# Patient Record
Sex: Male | Born: 1990 | Race: White | Hispanic: No | Marital: Single | Smoking: Never smoker
Health system: Southern US, Community
[De-identification: ages and names within clinical notes are randomized; demographics above are authoritative.]

## PROBLEM LIST (undated history)

## (undated) DIAGNOSIS — H9190 Unspecified hearing loss, unspecified ear: Secondary | ICD-10-CM

---
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
## 2009-01-16 ENCOUNTER — Emergency Department (HOSPITAL_COMMUNITY): Admission: EM | Admit: 2009-01-16 | Discharge: 2009-01-16 | Payer: Self-pay | Admitting: Emergency Medicine

## 2009-07-21 ENCOUNTER — Emergency Department (HOSPITAL_COMMUNITY): Admission: EM | Admit: 2009-07-21 | Discharge: 2009-07-21 | Payer: Self-pay | Admitting: Emergency Medicine

## 2010-01-31 ENCOUNTER — Emergency Department (HOSPITAL_COMMUNITY): Admission: EM | Admit: 2010-01-31 | Discharge: 2010-01-31 | Payer: Self-pay | Admitting: Family Medicine

## 2010-04-10 ENCOUNTER — Emergency Department (HOSPITAL_COMMUNITY): Admission: EM | Admit: 2010-04-10 | Discharge: 2010-04-10 | Payer: Self-pay | Admitting: Family Medicine

## 2010-06-20 ENCOUNTER — Emergency Department (HOSPITAL_COMMUNITY): Admission: EM | Admit: 2010-06-20 | Discharge: 2010-06-21 | Payer: Self-pay | Admitting: Emergency Medicine
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
## 2011-11-13 ENCOUNTER — Encounter (HOSPITAL_COMMUNITY): Payer: Self-pay | Admitting: *Deleted

## 2011-11-13 ENCOUNTER — Emergency Department (HOSPITAL_COMMUNITY)
Admission: EM | Admit: 2011-11-13 | Discharge: 2011-11-13 | Disposition: A | Discharge status: Home / Self Care | Payer: Medicaid Other | Source: Home / Self Care | Attending: Family Medicine | Admitting: Family Medicine

## 2011-11-13 DIAGNOSIS — J029 Acute pharyngitis, unspecified: Secondary | ICD-10-CM

## 2011-11-13 MED ORDER — AMOXICILLIN 500 MG PO CAPS: 500.0000 mg | ORAL_CAPSULE | Freq: Three times a day (TID) | ORAL | Status: AC

## 2011-11-13 NOTE — ED Notes (Signed)
Pt  Is  Deaf  However  He  Communicates   Well    By  Reading  Lips  And  Writing  He    Declines  A  Deaf  Interpretor

## 2011-11-13 NOTE — Discharge Instructions (Signed)
Tylenol or motrin as needed. Avoid caffeine and milk products. Follow up with your pcp or return if symoptoms worsen. Throat spray of choice.

## 2011-11-13 NOTE — ED Notes (Signed)
Pt  Has  Symptoms  Of  sorethroat  Headache       Symptoms  X  2  Days       -  Pt  Reports  Hurts  To  Swallow      Pt  Is  In no  Acute  Distress  He  Is  Sitting  Upright on  Exam table        Skin is  Warm  Dry

## 2011-11-13 NOTE — ED Provider Notes (Signed)
History     CSN: [id number]  Arrival date & time 11/13/11  Ronald Bean   First MD Initiated Contact with Patient 11/13/11 1929      Chief Complaint  Patient presents with  . Sore Throat    (Consider location/radiation/quality/duration/timing/severity/associated sxs/prior treatment) HPI Comments: The patient reports having a sore throat x 2-3 days. No runny nose, no cough, no fever. No treatment pta. Denies n/v, no rash  The history is provided by the patient.    History reviewed. No pertinent past medical history.  History reviewed. No pertinent past surgical history.  History reviewed. No pertinent family history.  History  Substance Use Topics  . Smoking status: Not on file  . Smokeless tobacco: Not on file  . Alcohol Use: Yes     occasonal      Review of Systems  HENT: Positive for sore throat. Negative for ear pain, congestion and rhinorrhea.   Respiratory: Negative.   Cardiovascular: Negative.   Gastrointestinal: Negative.   Genitourinary: Negative.   Skin: Negative.     Allergies  Review of patient's allergies indicates no known allergies.  Home Medications   Current Outpatient Rx  Name Route Sig Dispense Refill  . NYQUIL PO Oral Take by mouth.    . AMOXICILLIN 500 MG PO CAPS Oral Take 1 capsule (500 mg total) by mouth 3 (three) times daily. 30 capsule 0    BP 138/82  Pulse 95  Temp(Src) 100.1 F (37.8 C) (Oral)  Resp 16  SpO2 98%  Physical Exam  Nursing note and vitals reviewed. Constitutional: He appears well-developed and well-nourished. No distress.  HENT:       Ears clear, nose clear, throat red with swollen tonsils. No exudates seen  Neck: Normal range of motion. Neck supple.  Cardiovascular: Normal rate and regular rhythm.   Pulmonary/Chest: Effort normal and breath sounds normal.  Lymphadenopathy:    He has cervical adenopathy.    ED Course  Procedures (including critical care time)  Labs Reviewed - No data to display No results  found.   1. Pharyngitis       MDM          Randa Spike, MD 11/13/11 2013

## 2012-11-09 ENCOUNTER — Encounter (HOSPITAL_COMMUNITY): Payer: Self-pay | Admitting: *Deleted

## 2012-11-09 ENCOUNTER — Emergency Department (INDEPENDENT_AMBULATORY_CARE_PROVIDER_SITE_OTHER)
Admission: EM | Admit: 2012-11-09 | Discharge: 2012-11-09 | Disposition: A | Discharge status: Home / Self Care | Payer: 59 | Source: Home / Self Care | Attending: Family Medicine | Admitting: Family Medicine

## 2012-11-09 DIAGNOSIS — B001 Herpesviral vesicular dermatitis: Secondary | ICD-10-CM

## 2012-11-09 DIAGNOSIS — B009 Herpesviral infection, unspecified: Secondary | ICD-10-CM

## 2012-11-09 DIAGNOSIS — J329 Chronic sinusitis, unspecified: Secondary | ICD-10-CM

## 2012-11-09 LAB — POCT RAPID STREP A: Streptococcus, Group A Screen (Direct): NEGATIVE

## 2012-11-09 MED ORDER — VALACYCLOVIR HCL 1 G PO TABS
ORAL_TABLET | ORAL | Status: DC
Start: 2012-11-09 — End: 2014-02-07

## 2012-11-09 MED ORDER — AZITHROMYCIN 250 MG PO TABS: ORAL_TABLET | ORAL | Status: DC

## 2012-11-09 MED ORDER — IPRATROPIUM BROMIDE 0.06 % NA SOLN: 2.0000 | Freq: Four times a day (QID) | NASAL | Status: DC

## 2012-11-09 NOTE — ED Provider Notes (Signed)
History     CSN: [id number]  Arrival date & time 11/09/12  1256   First MD Initiated Contact with Patient 11/09/12 1306      Chief Complaint  Patient presents with  . Sore Throat    (Consider location/radiation/quality/duration/timing/severity/associated sxs/prior treatment) Patient is a 22 y.o. male presenting with pharyngitis. The history is provided by the patient. The history is limited by a language barrier. A language interpreter was used.  Sore Throat This is a new problem. The current episode started more than 1 week ago. The problem has not changed since onset.Associated symptoms comments: S/p recent sinus uri problem, now with sore throat.. The symptoms are aggravated by swallowing.    History reviewed. No pertinent past medical history.  History reviewed. No pertinent past surgical history.  No family history on file.  History  Substance Use Topics  . Smoking status: Not on file  . Smokeless tobacco: Not on file  . Alcohol Use: Yes     Comment: occasonal      Review of Systems  Constitutional: Negative.   HENT: Positive for congestion, sore throat and rhinorrhea.     Allergies  Review of patient's allergies indicates no known allergies.  Home Medications   Current Outpatient Rx  Name  Route  Sig  Dispense  Refill  . ACYCLOVIR PO   Oral   Take by mouth.         . Pseudoeph-Doxylamine-DM-APAP (NYQUIL PO)   Oral   Take by mouth.           BP 142/78  Pulse 7  Temp(Src) 98.9 F (37.2 C) (Oral)  Resp 16  SpO2 100%  Physical Exam  Nursing note and vitals reviewed. Constitutional: He is oriented to person, place, and time. He appears well-developed and well-nourished.  HENT:  Head: Normocephalic.  Right Ear: External ear normal.  Left Ear: External ear normal.  Nose: Nose normal.  Mouth/Throat: Oropharynx is clear and moist.  Eyes: Pupils are equal, round, and reactive to light.  Neck: Normal range of motion. Neck supple.   Pulmonary/Chest: Breath sounds normal.  Neurological: He is alert and oriented to person, place, and time.  Skin: Skin is warm and dry.    ED Course  Procedures (including critical care time)  Labs Reviewed  POCT RAPID STREP A ([facility] ONLY)   No results found.   1. Sinusitis nasal   2. Herpes labialis       MDM          Linna Hoff, MD 11/09/12 1524

## 2012-11-09 NOTE — ED Notes (Signed)
Pt  Is  Deaf  He has  An interpretor      He  Reports  Symptoms  Of  sorethroat  With  Chills  Lightheaded       X  1  Day     He  Ambulated  To  Room  With  Steady  Gait       Sitting  Upright on  Exam table  Speaking in  Complete  sentances

## 2013-04-20 ENCOUNTER — Emergency Department (INDEPENDENT_AMBULATORY_CARE_PROVIDER_SITE_OTHER)
Admission: EM | Admit: 2013-04-20 | Discharge: 2013-04-20 | Disposition: A | Discharge status: Home / Self Care | Payer: 59 | Source: Home / Self Care

## 2013-04-20 ENCOUNTER — Encounter (HOSPITAL_COMMUNITY): Payer: Self-pay | Admitting: Emergency Medicine

## 2013-04-20 DIAGNOSIS — T6391XA Toxic effect of contact with unspecified venomous animal, accidental (unintentional), initial encounter: Secondary | ICD-10-CM

## 2013-04-20 MED ORDER — TRIAMCINOLONE ACETONIDE 40 MG/ML IJ SUSP
40.0000 mg | Freq: Once | INTRAMUSCULAR | Status: AC
  Administered 2013-04-20: 40 mg via INTRAMUSCULAR

## 2013-04-20 MED ORDER — DIPHENHYDRAMINE HCL 50 MG PO TABS: 50.0000 mg | ORAL_TABLET | Freq: Four times a day (QID) | ORAL | Status: DC | PRN

## 2013-04-20 MED ORDER — TRIAMCINOLONE ACETONIDE 40 MG/ML IJ SUSP
INTRAMUSCULAR | Status: AC
  Filled 2013-04-20: qty 1

## 2013-04-20 NOTE — ED Notes (Signed)
Per deaf interpreter... Pt c/o insect bite to right hand onset 1200 today while working outside... sxs include: pain, swelling... Denies: SOB, fevers... Alert w/no signs of acute distress.

## 2013-04-20 NOTE — ED Provider Notes (Signed)
  CSN: [id number]     Arrival date & time 04/20/13  1721 History     None    Chief Complaint  Patient presents with  . Insect Bite   (Consider location/radiation/quality/duration/timing/severity/associated sxs/prior Treatment) Patient is a 22 y.o. male presenting with allergic reaction. The history is provided by the patient. The history is limited by a language barrier. A language interpreter was used (deaf interpretoer).  Allergic Reaction Presenting symptoms: swelling   Presenting symptoms: no difficulty breathing, no difficulty swallowing, no rash and no wheezing   Severity:  Mild Prior allergic episodes:  Insect allergies Context: insect bite/sting   Context comment:  Today while working outside.   History reviewed. No pertinent past medical history. History reviewed. No pertinent past surgical history. No family history on file. History  Substance Use Topics  . Smoking status: Not on file  . Smokeless tobacco: Not on file  . Alcohol Use: Yes     Comment: occasonal    Review of Systems  Constitutional: Negative.   HENT: Negative for trouble swallowing.   Respiratory: Negative for wheezing.   Musculoskeletal: Negative.   Skin: Negative.  Negative for rash.    Allergies  Review of patient's allergies indicates no known allergies.  Home Medications   Current Outpatient Rx  Name  Route  Sig  Dispense  Refill  . ACYCLOVIR PO   Oral   Take by mouth.         Marland Kitchen azithromycin (ZITHROMAX Z-PAK) 250 MG tablet      Take as directed on pack   6 each   0   . diphenhydrAMINE (BENADRYL) 50 MG tablet   Oral   Take 1 tablet (50 mg total) by mouth every 6 (six) hours as needed for itching or allergies.   30 tablet   0   . ipratropium (ATROVENT) 0.06 % nasal spray   Nasal   Place 2 sprays into the nose 4 (four) times daily.   15 mL   1   . Pseudoeph-Doxylamine-DM-APAP (NYQUIL PO)   Oral   Take by mouth.         . valACYclovir (VALTREX) 1000 MG tablet     Take 2 now and 2 in 12 hrs for each episode of fever blisters   4 tablet   1    BP 138/73  Pulse 77  Temp(Src) 98.9 F (37.2 C) (Oral)  Resp 12  SpO2 100% Physical Exam  Nursing note and vitals reviewed. Constitutional: He is oriented to person, place, and time. He appears well-developed and well-nourished.  HENT:  Mouth/Throat: Oropharynx is clear and moist.  Neck: Normal range of motion. Neck supple.  Cardiovascular: Regular rhythm, normal heart sounds and intact distal pulses.   Pulmonary/Chest: Breath sounds normal.  Musculoskeletal: He exhibits tenderness.       Hands: Lymphadenopathy:    He has no cervical adenopathy.  Neurological: He is alert and oriented to person, place, and time.  Skin: Skin is warm and dry.    ED Course   Procedures (including critical care time)  Labs Reviewed - No data to display No results found. 1. Insect sting, initial encounter     MDM    Linna Hoff, MD 04/20/13 [phone]

## 2014-02-07 ENCOUNTER — Emergency Department (HOSPITAL_COMMUNITY)
Admission: EM | Admit: 2014-02-07 | Discharge: 2014-02-07 | Disposition: A | Discharge status: Home / Self Care | Payer: 59 | Attending: Emergency Medicine | Admitting: Emergency Medicine

## 2014-02-07 ENCOUNTER — Encounter (HOSPITAL_COMMUNITY): Payer: Self-pay | Admitting: Emergency Medicine

## 2014-02-07 DIAGNOSIS — Y9289 Other specified places as the place of occurrence of the external cause: Secondary | ICD-10-CM | POA: Diagnosis not present

## 2014-02-07 DIAGNOSIS — S91009A Unspecified open wound, unspecified ankle, initial encounter: Principal | ICD-10-CM

## 2014-02-07 DIAGNOSIS — S81009A Unspecified open wound, unspecified knee, initial encounter: Secondary | ICD-10-CM | POA: Diagnosis present

## 2014-02-07 DIAGNOSIS — W268XXA Contact with other sharp object(s), not elsewhere classified, initial encounter: Secondary | ICD-10-CM | POA: Insufficient documentation

## 2014-02-07 DIAGNOSIS — Z79899 Other long term (current) drug therapy: Secondary | ICD-10-CM | POA: Insufficient documentation

## 2014-02-07 DIAGNOSIS — S81809A Unspecified open wound, unspecified lower leg, initial encounter: Principal | ICD-10-CM

## 2014-02-07 DIAGNOSIS — H913 Deaf nonspeaking, not elsewhere classified: Secondary | ICD-10-CM | POA: Diagnosis not present

## 2014-02-07 DIAGNOSIS — Y9301 Activity, walking, marching and hiking: Secondary | ICD-10-CM | POA: Diagnosis not present

## 2014-02-07 DIAGNOSIS — S81819A Laceration without foreign body, unspecified lower leg, initial encounter: Secondary | ICD-10-CM

## 2014-02-07 HISTORY — DX: Unspecified hearing loss, unspecified ear: H91.90

## 2014-02-07 NOTE — ED Provider Notes (Signed)
CSN: [id number]     Arrival date & time 02/07/14  1816 History   First MD Initiated Contact with Patient 02/07/14 1956     Chief Complaint  Patient presents with  . Extremity Laceration     (Consider location/radiation/quality/duration/timing/severity/associated sxs/prior Treatment) HPI Comments: 23 year old deaf male presents to the emergency department with his mother with a laceration to his left leg occurring around 6:15 PM tonight. Patient states he was walking through a high grassy area when he walked into a metal bracket. States there was a lot of blood initially. Currently states his pain is 4/10. Denies numbness or tingling. Last tetanus shot was in 2013. Denies lightheadedness or dizziness.  The history is provided by the patient and a parent. A language interpreter was used (sign language).    Past Medical History  Diagnosis Date  . Deaf    History reviewed. No pertinent past surgical history. History reviewed. No pertinent family history. History  Substance Use Topics  . Smoking status: Never Smoker   . Smokeless tobacco: Not on file  . Alcohol Use: Yes     Comment: occasonal    Review of Systems  Skin: Positive for wound.  All other systems reviewed and are negative.     Allergies  Review of patient's allergies indicates no known allergies.  Home Medications   Prior to Admission medications   Medication Sig Start Date End Date Taking? Authorizing Provider  Cetirizine HCl (ZYRTEC PO) Take 1 tablet by mouth daily.   Yes Historical Provider, MD   BP 180/90  Pulse 100  Temp(Src) 98.7 F (37.1 C) (Oral)  Resp 18  SpO2 95% Physical Exam  Nursing note and vitals reviewed. Constitutional: He is oriented to person, place, and time. He appears well-developed and well-nourished. No distress.  HENT:  Head: Normocephalic and atraumatic.  Mouth/Throat: Oropharynx is clear and moist.  Eyes: Conjunctivae are normal.  Neck: Normal range of motion. Neck supple.    Cardiovascular: Normal rate, regular rhythm and normal heart sounds.   +2 PT/DP pulses on left.  Pulmonary/Chest: Effort normal and breath sounds normal.  Musculoskeletal: Normal range of motion. He exhibits no edema.       Legs: Full range of motion left knee and ankle. No evidence of tendon disruption.  Neurological: He is alert and oriented to person, place, and time.  Skin: Skin is warm and dry. He is not diaphoretic.  Psychiatric: He has a normal mood and affect. His behavior is normal.    ED Course  Procedures (including critical care time) LACERATION REPAIR Performed by: Trevor Maceobyn M Albert Authorized by: Trevor Maceobyn M Albert Consent: Verbal consent obtained. Risks and benefits: risks, benefits and alternatives were discussed Consent given by: patient Patient identity confirmed: provided demographic data Prepped and Draped in normal sterile fashion Wound explored  Laceration Location: left lower leg  Laceration Length: 7 cm  No Foreign Bodies seen or palpated  Anesthesia: local infiltration  Local anesthetic: lidocaine 2% with epinephrine  Anesthetic total: 6 ml  Irrigation method: syringe Amount of cleaning: standard  Skin closure: 3-0 prolene  Number of sutures: 9  Technique: simple interrupted  Patient tolerance: Patient tolerated the procedure well with no immediate complications.  Labs Review Labs Reviewed - No data to display  Imaging Review No results found.   EKG Interpretation None      MDM   Final diagnoses:  Laceration of lower leg    Neurovascularly intact. Wound care given. No tachycardia on my exam. No evidence  of tendon disruption. Stable for discharge. Return precautions given. Patient states understanding of treatment care plan and is agreeable.   Trevor Mace, PA-C 02/07/14 2041

## 2014-02-07 NOTE — ED Notes (Signed)
Interpreter called and someone will be here in 10 minutes.

## 2014-02-07 NOTE — ED Provider Notes (Signed)
Medical screening examination/treatment/procedure(s) were performed by non-physician practitioner and as supervising physician I was immediately available for consultation/collaboration.   EKG Interpretation None        Kristen N Ward, DO 02/07/14 2347 

## 2014-02-07 NOTE — ED Notes (Signed)
Patient is alert and orineted x3.  He is being seen for a laceration to his leg that happened at 6:15. Patient states that he was walking through a high grassy area and cut his leg on a metal bracket.  Currently he rates his pain 4 of 10.  Bleeding is controlled.

## 2014-02-07 NOTE — Discharge Instructions (Signed)

## 2014-02-18 ENCOUNTER — Encounter (HOSPITAL_COMMUNITY): Payer: Self-pay | Admitting: Emergency Medicine

## 2014-02-18 ENCOUNTER — Emergency Department (INDEPENDENT_AMBULATORY_CARE_PROVIDER_SITE_OTHER)
Admission: EM | Admit: 2014-02-18 | Discharge: 2014-02-18 | Disposition: A | Discharge status: Home / Self Care | Payer: 59 | Source: Home / Self Care | Attending: Family Medicine | Admitting: Family Medicine

## 2014-02-18 DIAGNOSIS — S81812A Laceration without foreign body, left lower leg, initial encounter: Secondary | ICD-10-CM

## 2014-02-18 DIAGNOSIS — Z4802 Encounter for removal of sutures: Secondary | ICD-10-CM

## 2014-02-18 NOTE — Discharge Instructions (Signed)
Your sutured laceration appears to be healing well. Since you are going swimming today we will wait until Monday to remove your sutures.

## 2014-02-18 NOTE — ED Notes (Signed)
Pt  Reports      Here  For suture  Removal          Wound  Appears  Healing  Well        Have  Been in place  For  10  Days

## 2014-02-18 NOTE — ED Provider Notes (Signed)
Medical screening examination/treatment/procedure(s) were performed by a resident physician or non-physician practitioner and as the supervising physician I was immediately available for consultation/collaboration.  Sarahlynn Cisnero, MD    Ayla Dunigan S Shakirah Kirkey, MD 02/18/14 1822 

## 2014-02-18 NOTE — ED Provider Notes (Signed)
CSN: [id number]     Arrival date & time 02/18/14  0913 History   First MD Initiated Contact with Patient 02/18/14 [phone]     Chief Complaint  Patient presents with  . Suture / Staple Removal    Patient is a 23 y.o. male presenting with suture removal. The history is provided by the patient.  Suture / Staple Removal This is a new problem. The current episode started more than 1 week ago. The problem has been gradually improving.  Pt seen on 02/07/2014 in [facility] for laceration to LLE. Had 9 sutures placed and returns today for suture removal. Sutures wound appears to be healing well w/o s/s infection. Pt states he wants to go swimming today. Decision made to leave sutures in until Monday. Pt agreeable w/ plan. Past Medical History  Diagnosis Date  . Deaf    History reviewed. No pertinent past surgical history. History reviewed. No pertinent family history. History  Substance Use Topics  . Smoking status: Never Smoker   . Smokeless tobacco: Not on file  . Alcohol Use: Yes     Comment: occasonal    Review of Systems  All other systems reviewed and are negative.   Allergies  Review of patient's allergies indicates no known allergies.  Home Medications   Prior to Admission medications   Medication Sig Start Date End Date Taking? Authorizing Provider  Cetirizine HCl (ZYRTEC PO) Take 1 tablet by mouth daily.    Historical Provider, MD   BP 127/80  Pulse 74  Temp(Src) 97.4 F (36.3 C) (Oral)  Resp 14  SpO2 99% Physical Exam  Constitutional: He is oriented to person, place, and time. He appears well-developed and well-nourished.  HENT:  Head: Normocephalic and atraumatic.  Eyes: Conjunctivae are normal.  Pulmonary/Chest: Effort normal.  Musculoskeletal:       Legs: Neurological: He is alert and oriented to person, place, and time.  Skin: Skin is warm and dry.  Psychiatric: He has a normal mood and affect.    ED Course  Procedures (including critical care time) Labs  Review Labs Reviewed - No data to display  Imaging Review No results found.   MDM   1. Laceration of left lower extremity    Sutured laceration to LLE healing well. Will return Monday for suture removal. OK to swim today. Dr Denyse Amass is agreeable w/ plan and also saw pt at bedside.    Leanne Chang, NP 02/18/14 1022

## 2014-02-20 ENCOUNTER — Encounter (HOSPITAL_COMMUNITY): Payer: Self-pay | Admitting: Emergency Medicine

## 2014-02-20 ENCOUNTER — Emergency Department (INDEPENDENT_AMBULATORY_CARE_PROVIDER_SITE_OTHER)
Admission: EM | Admit: 2014-02-20 | Discharge: 2014-02-20 | Disposition: A | Discharge status: Home / Self Care | Payer: 59 | Source: Home / Self Care | Attending: Family Medicine | Admitting: Family Medicine

## 2014-02-20 DIAGNOSIS — Z4802 Encounter for removal of sutures: Secondary | ICD-10-CM

## 2014-02-20 DIAGNOSIS — S81812A Laceration without foreign body, left lower leg, initial encounter: Secondary | ICD-10-CM

## 2014-02-20 DIAGNOSIS — S81809A Unspecified open wound, unspecified lower leg, initial encounter: Secondary | ICD-10-CM

## 2014-02-20 NOTE — Discharge Instructions (Signed)
Thank you for coming in today. Have fun in [place]  Scar Minimization You will have a scar anytime you have surgery and a cut is made in the skin or you have something removed from your skin (mole, skin cancer, cyst). Although scars are unavoidable following surgery, there are ways to minimize their appearance. It is important to follow all the instructions you receive from your caregiver about wound care. How your wound heals will influence the appearance of your scar. If you do not follow the wound care instructions as directed, complications such as infection may occur. Wound instructions include keeping the wound clean, moist, and not letting the wound form a scab. Some people form scars that are raised and lumpy (hypertrophic) or larger than the initial wound (keloidal). HOME CARE INSTRUCTIONS   Follow wound care instructions as directed.  Keep the wound clean by washing it with soap and water.  Keep the wound moist with provided antibiotic cream or petroleum jelly until completely healed. Moisten twice a day for about 2 weeks.  Get stitches (sutures) taken out at the scheduled time.  Avoid touching or manipulating your wound unless needed. Wash your hands thoroughly before and after touching your wound.  Follow all restrictions such as limits on exercise or work. This depends on where your scar is located.  Keep the scar protected from sunburn. Cover the scar with sunscreen/sunblock with SPF 30 or higher.  Gently massage the scar using a circular motion to help minimize the appearance of the scar. Do this only after the wound has closed and all the sutures have been removed.  For hypertrophic or keloidal scars, there are several ways to treat and minimize their appearance. Methods include compression therapy, intralesional corticosteroids, laser therapy, or surgery. These methods are performed by your caregiver. Remember that the scar may appear lighter or darker than your normal skin  color. This difference in color should even out with time. SEEK MEDICAL CARE IF:   You have a fever.  You develop signs of infection such as pain, redness, pus, and warmth.  You have questions or concerns. Document Released: 02/19/2010 Document Revised: 11/24/2011 Document Reviewed: 02/19/2010 [facility] Patient Information 2014 [place], [place].

## 2014-02-20 NOTE — ED Provider Notes (Signed)
Ronald Bean is a 23 y.o. male who presents to Urgent Care today for left leg laceration. Patient suffered a laceration of left lateral leg 16 days ago. He is here today for suture removal. He feels well. No pain fevers or chills   Past Medical History  Diagnosis Date  . Deaf    History  Substance Use Topics  . Smoking status: Never Smoker   . Smokeless tobacco: Not on file  . Alcohol Use: Yes     Comment: occasonal   ROS as above Medications: No current facility-administered medications for this encounter.   Current Outpatient Prescriptions  Medication Sig Dispense Refill  . Cetirizine HCl (ZYRTEC PO) Take 1 tablet by mouth daily.        Exam:  BP 135/81  Pulse 68  Temp(Src) 98.1 F (36.7 C) (Oral)  Resp 16  SpO2 99% Gen: Well NAD Left lateral leg:  Linear laceration. No erythema induration or tenderness.  Sutures removed  No results found for this or any previous visit (from the past 24 hour(s)). No results found.  Assessment and Plan: 23 y.o. male with left leg laceration. Sutures removed. Followup as needed.  Discussed warning signs or symptoms. Please see discharge instructions. Patient expresses understanding.    Rodolph Bong, MD 02/20/14 1249

## 2014-02-20 NOTE — ED Notes (Signed)
C/o left leg suture removal
